# Patient Record
Sex: Female | Born: 1975 | Race: Black or African American | Hispanic: No | Marital: Single | Smoking: Current every day smoker
Health system: Southern US, Community
[De-identification: ages and names within clinical notes are randomized; demographics above are authoritative.]

---

## 2004-03-30 ENCOUNTER — Emergency Department (HOSPITAL_COMMUNITY): Admission: EM | Admit: 2004-03-30 | Discharge: 2004-03-30 | Payer: Self-pay | Admitting: Family Medicine

## 2004-06-18 ENCOUNTER — Emergency Department (HOSPITAL_COMMUNITY): Admission: EM | Admit: 2004-06-18 | Discharge: 2004-06-19 | Payer: Self-pay | Admitting: Emergency Medicine

## 2005-04-10 ENCOUNTER — Emergency Department (HOSPITAL_COMMUNITY): Admission: EM | Admit: 2005-04-10 | Discharge: 2005-04-10 | Payer: Self-pay | Admitting: Family Medicine

## 2005-08-07 ENCOUNTER — Emergency Department (HOSPITAL_COMMUNITY): Admission: EM | Admit: 2005-08-07 | Discharge: 2005-08-07 | Payer: Self-pay | Admitting: Emergency Medicine

## 2005-08-13 ENCOUNTER — Emergency Department (HOSPITAL_COMMUNITY): Admission: EM | Admit: 2005-08-13 | Discharge: 2005-08-13 | Payer: Self-pay | Admitting: Emergency Medicine

## 2006-03-20 ENCOUNTER — Emergency Department (HOSPITAL_COMMUNITY): Admission: EM | Admit: 2006-03-20 | Discharge: 2006-03-20 | Payer: Self-pay | Admitting: Family Medicine

## 2006-11-18 ENCOUNTER — Emergency Department (HOSPITAL_COMMUNITY): Admission: EM | Admit: 2006-11-18 | Discharge: 2006-11-18 | Payer: Self-pay | Admitting: Emergency Medicine

## 2008-10-27 ENCOUNTER — Emergency Department (HOSPITAL_COMMUNITY): Admission: EM | Admit: 2008-10-27 | Discharge: 2008-10-27 | Payer: Self-pay | Admitting: Emergency Medicine

## 2011-01-29 LAB — POCT I-STAT, CHEM 8
BUN: 4 mg/dL — ABNORMAL LOW (ref 6–23)
Creatinine, Ser: 0.6 mg/dL (ref 0.4–1.2)
Glucose, Bld: 102 mg/dL — ABNORMAL HIGH (ref 70–99)
Potassium: 3.4 mEq/L — ABNORMAL LOW (ref 3.5–5.1)
Sodium: 138 mEq/L (ref 135–145)

## 2011-01-29 LAB — ABO/RH: ABO/RH(D): O POS

## 2011-01-29 LAB — HCG, QUANTITATIVE, PREGNANCY: hCG, Beta Chain, Quant, S: 4087 m[IU]/mL — ABNORMAL HIGH (ref <5)

## 2017-03-26 ENCOUNTER — Emergency Department (HOSPITAL_COMMUNITY): Payer: Medicaid Other

## 2017-03-26 ENCOUNTER — Emergency Department (HOSPITAL_COMMUNITY)
Admission: EM | Admit: 2017-03-26 | Discharge: 2017-03-26 | Discharge status: Against Medical Advice | Payer: Medicaid Other | Attending: Emergency Medicine | Admitting: Emergency Medicine

## 2017-03-26 ENCOUNTER — Encounter (HOSPITAL_COMMUNITY): Payer: Self-pay | Admitting: Emergency Medicine

## 2017-03-26 DIAGNOSIS — F17210 Nicotine dependence, cigarettes, uncomplicated: Secondary | ICD-10-CM | POA: Diagnosis not present

## 2017-03-26 DIAGNOSIS — Y92410 Unspecified street and highway as the place of occurrence of the external cause: Secondary | ICD-10-CM | POA: Diagnosis not present

## 2017-03-26 DIAGNOSIS — Y939 Activity, unspecified: Secondary | ICD-10-CM | POA: Insufficient documentation

## 2017-03-26 DIAGNOSIS — M25552 Pain in left hip: Secondary | ICD-10-CM | POA: Diagnosis not present

## 2017-03-26 DIAGNOSIS — M25511 Pain in right shoulder: Secondary | ICD-10-CM | POA: Diagnosis not present

## 2017-03-26 DIAGNOSIS — M5412 Radiculopathy, cervical region: Secondary | ICD-10-CM | POA: Diagnosis not present

## 2017-03-26 DIAGNOSIS — Y999 Unspecified external cause status: Secondary | ICD-10-CM | POA: Insufficient documentation

## 2017-03-26 DIAGNOSIS — M5442 Lumbago with sciatica, left side: Secondary | ICD-10-CM | POA: Diagnosis not present

## 2017-03-26 DIAGNOSIS — M5432 Sciatica, left side: Secondary | ICD-10-CM

## 2017-03-26 DIAGNOSIS — M545 Low back pain: Secondary | ICD-10-CM | POA: Diagnosis present

## 2017-03-26 MED ORDER — ACETAMINOPHEN 325 MG PO TABS: 650.0000 mg | ORAL_TABLET | Freq: Once | ORAL | Status: DC

## 2017-03-26 MED ORDER — PREDNISONE 10 MG (21) PO TBPK: ORAL_TABLET | Freq: Every day | ORAL | 0 refills | Status: AC

## 2017-03-26 NOTE — ED Triage Notes (Signed)
Pt to ER for left leg pain radiating from lower back to left knee, onset "years ago" states is getting worse and causing her to fall.  Pt ambulatory at present without difficulty. Also reports right shoulder pain onset in April, worse with movement. NAD.

## 2017-03-26 NOTE — Discharge Instructions (Signed)
Take prednisone as prescribed.  Can follow up with [facility] for further care and management of your conditions.  Return to ED if you experience worsening pain, increased falls, or more persistent numbness and tingling in your R arm.
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
## 2017-03-26 NOTE — ED Notes (Signed)
Pt refused to sign AMA form.

## 2017-03-26 NOTE — ED Notes (Signed)
Pt refused the Tylenol.  Pt wants "real pain meds".  RN discussed with pt that she needed to take the Prednisone as prescribed but Pt wished to leave the ED. RN followed the Pt as she was walking out and tried to get her to stay and go through discharge paperwork. Pt is upset with how she was treated today and that she did not get the meds she wanted.  Pt elects to leave AMA.  Pt was A&Ox4 and had steady gait.  Pt states " I am going to a smoke a cigarette and find a real hospital."
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
## 2018-09-27 ENCOUNTER — Emergency Department (HOSPITAL_COMMUNITY)
Admission: EM | Admit: 2018-09-27 | Discharge: 2018-09-27 | Disposition: A | Discharge status: Home / Self Care | Payer: Medicaid Other | Attending: Emergency Medicine | Admitting: Emergency Medicine

## 2018-09-27 ENCOUNTER — Encounter (HOSPITAL_COMMUNITY): Payer: Self-pay

## 2018-09-27 DIAGNOSIS — K047 Periapical abscess without sinus: Secondary | ICD-10-CM

## 2018-09-27 DIAGNOSIS — K029 Dental caries, unspecified: Secondary | ICD-10-CM

## 2018-09-27 DIAGNOSIS — F17210 Nicotine dependence, cigarettes, uncomplicated: Secondary | ICD-10-CM | POA: Insufficient documentation

## 2018-09-27 MED ORDER — AMOXICILLIN 500 MG PO CAPS
1000.0000 mg | ORAL_CAPSULE | Freq: Once | ORAL | Status: AC
  Administered 2018-09-27: 1000 mg via ORAL
  Filled 2018-09-27: qty 2

## 2018-09-27 MED ORDER — AMOXICILLIN 500 MG PO CAPS: 500.0000 mg | ORAL_CAPSULE | Freq: Three times a day (TID) | ORAL | 0 refills | Status: AC

## 2018-09-27 MED ORDER — IBUPROFEN 400 MG PO TABS
600.0000 mg | ORAL_TABLET | Freq: Once | ORAL | Status: AC
  Administered 2018-09-27: 600 mg via ORAL
  Filled 2018-09-27: qty 1

## 2018-09-27 NOTE — ED Triage Notes (Signed)
Pt c/o upper front tooth broken and bottom back left tooth abscess

## 2018-09-27 NOTE — Discharge Instructions (Addendum)
It was our pleasure to provide your ER care today - we hope that you feel better.  Take antibiotic (amoxicillin) as prescribed.   Take ibuprofen and/or acetaminophen as need for pain.  Follow up with dentist in the coming week - see attached dental resource guide.   Return to ER if worse, severe swelling, spreading redness, high fevers, other concern.

## 2018-09-27 NOTE — ED Notes (Signed)
If this patient comes in please have her contact Burna MortimerWanda from Case Management at [phone] Please.

## 2018-09-27 NOTE — ED Provider Notes (Signed)
[facility] EMERGENCY DEPARTMENT Provider Note   CSN: [id number] Arrival date & time: 09/27/18  1523     History   Chief Complaint Chief Complaint  Patient presents with  . Dental Pain    HPI Debbie Escobar is a 42 y.o. female.  Patient c/o pain to bilateral lower molars for past several days - notes hx same. Also with previously cracked/decayed tooth right upper, frontal, and now states that area is painful and swollen. Pain moderate, constant, worse w palpation area/tenderness to touch. No fever/chills. No neck pain or swelling. No trouble breathing or swallowing. Has no local dentist.   The history is provided by the patient.  Dental Pain      History reviewed. No pertinent past medical history.  There are no active problems to display for this patient.   History reviewed. No pertinent surgical history.   OB History   No obstetric history on file.      Home Medications    Prior to Admission medications   Medication Sig Start Date End Date Taking? Authorizing Provider  predniSONE (STERAPRED UNI-PAK 21 TAB) 10 MG (21) TBPK tablet Take by mouth daily. Take 6 tabs by mouth daily  for 2 days, then 5 tabs for 2 days, then 4 tabs for 2 days, then 3 tabs for 2 days, 2 tabs for 2 days, then 1 tab by mouth daily for 2 days 03/26/17   Caccavale, Sophia, PA-C    Family History History reviewed. No pertinent family history.  Social History Social History   Tobacco Use  . Smoking status: Current Every Day Smoker    Packs/day: 0.50    Types: Cigarettes  . Smokeless tobacco: Never Used  Substance Use Topics  . Alcohol use: No  . Drug use: Not on file     Allergies   Erythromycin and Tetracyclines & related   Review of Systems Review of Systems  Constitutional: Negative for fever.  HENT: Positive for dental problem. Negative for sore throat and trouble swallowing.   Respiratory: Negative for shortness of breath.   Gastrointestinal: Negative  for vomiting.  Musculoskeletal: Negative for neck pain and neck stiffness.  Neurological: Negative for headaches.     Physical Exam Updated Vital Signs BP 129/86 (BP Location: Right Arm)   Pulse 81   Temp 98.9 F (37.2 C) (Oral)   Resp 16   SpO2 99%   Physical Exam Vitals signs and nursing note reviewed.  Constitutional:      Appearance: Normal appearance. She is well-developed.  HENT:     Head: Atraumatic.     Nose: Nose normal.     Mouth/Throat:     Comments: Several teeth with decay/caries. No trismus. Tooth #7 w decay/broken off, with associated gum swelling/tenderness c/w dental abscess. No facial swelling or erythema. No swelling or pain to neck. Pharynx normal.  Eyes:     General: No scleral icterus.    Conjunctiva/sclera: Conjunctivae normal.  Neck:     Musculoskeletal: Normal range of motion and neck supple.     Trachea: No tracheal deviation.  Cardiovascular:     Rate and Rhythm: Normal rate.  Pulmonary:     Effort: Pulmonary effort is normal. No respiratory distress.     Breath sounds: No stridor.  Lymphadenopathy:     Cervical: No cervical adenopathy.  Skin:    General: Skin is warm and dry.     Findings: No rash.  Neurological:     Mental Status: She is alert.  Comments: Speech normal. Stead gait.   Psychiatric:        Mood and Affect: Mood normal.      ED Treatments / Results  Labs (all labs ordered are listed, but only abnormal results are displayed) Labs Reviewed - No data to display  EKG None  Radiology No results found.  Procedures Procedures (including critical care time)  Medications Ordered in ED Medications  amoxicillin (AMOXIL) capsule 1,000 mg (has no administration in time range)  ibuprofen (ADVIL,MOTRIN) tablet 600 mg (has no administration in time range)     Initial Impression / Assessment and Plan / ED Course  I have reviewed the triage vital signs and the nursing notes.  Pertinent labs & imaging results that were  available during my care of the patient were reviewed by me and considered in my medical decision making (see chart for details).  Confirmed only allergy is emycin and tetracycline.  Amoxicillin po. Motrin po.  Discussed need for close outpt dental f/u.  Reviewed nursing notes and prior charts for additional history.   Return precautions provided.     Final Clinical Impressions(s) / ED Diagnoses   Final diagnoses:  None    ED Discharge Orders    None       Cathren LaineSteinl, Levante Simones, MD 09/27/18 [phone]

## 2018-09-27 NOTE — Care Management (Signed)
[facility].  Pt will receive prescription for free.  Pt given bus vouchers to get to pharmacy and to shelter.

## 2018-09-27 NOTE — ED Notes (Signed)
Pt stable, ambulatory, states understanding of discharge instructions 

## 2018-09-27 NOTE — Progress Notes (Signed)
CSW received call from RN requesting CSW meet with patient to assist with barriers in the community for discharge. CSW met with patient and noted concerns with attaining prescription antibiotics, housing, and transportation. CSW noted patient was optimism and reported she was engaged and actively reaching out for supports already such as currently being involved with [place]. CSW provided patient with list of community resources for homelessness and provided patient with a bus pass to assist making it to a shelter tonight. CSW contacted RN CM Caryl Pina regarding assistance with medication. CSW noted patient has out of state Medicaid listed, however patient denied having knowledge and it was several years ago in either [facility] or [place]. CSW encouraged patient to reach out to local DSS. CSW signing off.  Lamonte Richer, LCSW, [place] Worker II [phone]
